# Patient Record
Sex: Male | Born: 1980 | Race: White | Hispanic: No | Marital: Single | Smoking: Current every day smoker
Health system: Southern US, Community
[De-identification: ages and names within clinical notes are randomized; demographics above are authoritative.]

---

## 2022-05-13 ENCOUNTER — Emergency Department (HOSPITAL_COMMUNITY)
Admission: EM | Admit: 2022-05-13 | Discharge: 2022-05-14 | Disposition: A | Discharge status: Home / Self Care | Payer: Medicare Other | Attending: Emergency Medicine | Admitting: Emergency Medicine

## 2022-05-13 ENCOUNTER — Other Ambulatory Visit: Payer: Self-pay

## 2022-05-13 ENCOUNTER — Encounter (HOSPITAL_COMMUNITY): Payer: Self-pay | Admitting: Emergency Medicine

## 2022-05-13 DIAGNOSIS — M545 Low back pain, unspecified: Secondary | ICD-10-CM | POA: Diagnosis not present

## 2022-05-13 DIAGNOSIS — I10 Essential (primary) hypertension: Secondary | ICD-10-CM | POA: Insufficient documentation

## 2022-05-13 DIAGNOSIS — R079 Chest pain, unspecified: Secondary | ICD-10-CM | POA: Insufficient documentation

## 2022-05-13 DIAGNOSIS — R519 Headache, unspecified: Secondary | ICD-10-CM | POA: Diagnosis present

## 2022-05-13 DIAGNOSIS — R7309 Other abnormal glucose: Secondary | ICD-10-CM | POA: Insufficient documentation

## 2022-05-13 DIAGNOSIS — R5383 Other fatigue: Secondary | ICD-10-CM | POA: Diagnosis not present

## 2022-05-13 LAB — CBG MONITORING, ED: Glucose-Capillary: 122 mg/dL — ABNORMAL HIGH (ref 70–99)
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
## 2022-05-14 ENCOUNTER — Encounter (HOSPITAL_COMMUNITY): Payer: Self-pay

## 2022-05-14 ENCOUNTER — Emergency Department (HOSPITAL_COMMUNITY): Payer: Medicare Other

## 2022-05-14 ENCOUNTER — Emergency Department (HOSPITAL_COMMUNITY)
Admission: EM | Admit: 2022-05-14 | Discharge: 2022-05-14 | Disposition: A | Discharge status: Home / Self Care | Payer: Medicare Other | Source: Home / Self Care | Attending: Emergency Medicine | Admitting: Emergency Medicine

## 2022-05-14 DIAGNOSIS — R519 Headache, unspecified: Secondary | ICD-10-CM | POA: Insufficient documentation

## 2022-05-14 DIAGNOSIS — M545 Low back pain, unspecified: Secondary | ICD-10-CM | POA: Insufficient documentation

## 2022-05-14 DIAGNOSIS — R079 Chest pain, unspecified: Secondary | ICD-10-CM | POA: Insufficient documentation

## 2022-05-14 DIAGNOSIS — I10 Essential (primary) hypertension: Secondary | ICD-10-CM | POA: Insufficient documentation

## 2022-05-14 DIAGNOSIS — R5383 Other fatigue: Secondary | ICD-10-CM | POA: Insufficient documentation

## 2022-05-14 LAB — CBC
HCT: 45.9 % (ref 39.0–52.0)
Hemoglobin: 15.7 g/dL (ref 13.0–17.0)
MCH: 31.4 pg (ref 26.0–34.0)
MCHC: 34.2 g/dL (ref 30.0–36.0)
MCV: 91.8 fL (ref 80.0–100.0)
Platelets: 243 10*3/uL (ref 150–400)
RBC: 5 MIL/uL (ref 4.22–5.81)
RDW: 13.6 % (ref 11.5–15.5)
WBC: 7.5 10*3/uL (ref 4.0–10.5)
nRBC: 0 % (ref 0.0–0.2)

## 2022-05-14 LAB — COMPREHENSIVE METABOLIC PANEL
ALT: 31 U/L (ref 0–44)
AST: 17 U/L (ref 15–41)
Albumin: 3.8 g/dL (ref 3.5–5.0)
Alkaline Phosphatase: 81 U/L (ref 38–126)
Anion gap: 8 (ref 5–15)
BUN: 11 mg/dL (ref 6–20)
CO2: 24 mmol/L (ref 22–32)
Calcium: 8.6 mg/dL — ABNORMAL LOW (ref 8.9–10.3)
Chloride: 106 mmol/L (ref 98–111)
Creatinine, Ser: 0.85 mg/dL (ref 0.61–1.24)
GFR, Estimated: >60 mL/min (ref >60)
Glucose, Bld: 128 mg/dL — ABNORMAL HIGH (ref 70–99)
Potassium: 3.9 mmol/L (ref 3.5–5.1)
Sodium: 138 mmol/L (ref 135–145)
Total Bilirubin: 0.3 mg/dL (ref 0.3–1.2)
Total Protein: 6.8 g/dL (ref 6.5–8.1)

## 2022-05-14 MED ORDER — LORAZEPAM 1 MG PO TABS
1.0000 mg | ORAL_TABLET | Freq: Once | ORAL | Status: AC
  Administered 2022-05-14: 1 mg via ORAL
  Filled 2022-05-14: qty 1

## 2022-05-14 MED ORDER — ACETAMINOPHEN 500 MG PO TABS
1000.0000 mg | ORAL_TABLET | Freq: Once | ORAL | Status: AC
  Administered 2022-05-14: 1000 mg via ORAL
  Filled 2022-05-14: qty 2
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
## 2022-05-14 NOTE — ED Notes (Signed)
ED Provider at bedside. 

## 2022-05-14 NOTE — Discharge Instructions (Addendum)
Your CT scan was read by a neuroradiologist.  This is a doctor who specializes in reading CT scans of the brain.  Your CT scan is completely normal.  Please see your neurologist, as scheduled.

## 2022-05-14 NOTE — ED Notes (Signed)
RN reviewed discharge instructions with pt. Pt verbalized understanding and had no further questions. VSS upon discharge.  

## 2022-05-14 NOTE — ED Triage Notes (Signed)
Pt arrived via EMS, was at urgent care looking for neurologist. C/o pain throughout entire body, states he was seen at [facility] yesterday for same. States he had head CT, but does not believe the read of the CT.

## 2022-05-14 NOTE — ED Notes (Signed)
Pt returned to room  

## 2022-05-14 NOTE — ED Notes (Signed)
Roxanne Mins MD reviewed CT scan results with pt. Pt stated that there was a "dark area" on the scans. MD informed pt that his scans were unremarkable. Pt stated he wanted a second opinion and proceeded to leave to go to the lobby. This RN and Roxanne Mins MD attempted to redirect pt back to room with no success
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
## 2022-05-14 NOTE — ED Triage Notes (Signed)
Pt returns to get results of CT scan, left AMA from ER during tx for HA and low back pain, see previous triage note.

## 2022-05-14 NOTE — Discharge Instructions (Signed)
You may apply ice to your lower back as needed.  Ice to be applied for 30 minutes at a time, as often as 4 times a day.  You may take ibuprofen and/or acetaminophen as needed for pain-either your headache or backache.  Please be aware that if you combine ibuprofen and acetaminophen, you will get better pain relief than you get from either medication by itself.  Please follow-up with the neurologist that you say you have an appointment with.

## 2022-05-14 NOTE — ED Notes (Signed)
Patient transported to CT 

## 2022-05-14 NOTE — ED Notes (Signed)
Patient states he is leaving. RN notified

## 2022-05-14 NOTE — ED Notes (Signed)
Urine sample collected in triage 

## 2022-05-14 NOTE — ED Notes (Signed)
Pt left before discharge paperwork was given. This RN reviewed CT results with pt before pt left.

## 2022-05-14 NOTE — ED Provider Notes (Signed)
[facility] EMERGENCY DEPARTMENT Provider Note   CSN: [id number] Arrival date & time: 05/14/22  8338     History  Chief Complaint  Patient presents with   Headache    Micheal Short is a 41 y.o. male.  The history is provided by the patient.  Headache He has history of hypertension, bipolar disorder, substance abuse and had just been discharged from the ED following evaluation for headache and low back pain.  Symptoms have been present for about a month and he had presented here requesting CT scan and stating that he was supposed to see his neurologist later today.  CT was negative, but he returns stating he knows that something is wrong and he wants to see his CT scan.   Home Medications Prior to Admission medications   Medication Sig Start Date End Date Taking? Authorizing Provider  benztropine (COGENTIN) 0.5 MG tablet Take 0.5 mg by mouth 2 (two) times daily. 04/23/22  Yes [provider]  OLANZapine zydis (ZYPREXA) 10 MG disintegrating tablet Take 10 mg by mouth 2 (two) times daily. 04/26/22  Yes [provider]  QUEtiapine (SEROQUEL) 400 MG tablet Take 800 mg by mouth at bedtime. 04/23/22  Yes [provider]      Allergies    Haldol [haloperidol]    Review of Systems   Review of Systems  Neurological:  Positive for headaches.  All other systems reviewed and are negative.  Physical Exam Updated Vital Signs BP 133/90 (BP Location: Right Arm)   Pulse (!) 102   Temp 98.2 F (36.8 C) (Oral)   Resp 20   SpO2 99%  Physical Exam Vitals and nursing note reviewed.  41 year old male, resting comfortably and in no acute distress. Vital signs are significant for borderline elevated heart rate. Oxygen saturation is 99%, which is normal. Head is normocephalic and atraumatic. PERRLA, EOMI. Oropharynx is clear. Neck is nontender and supple without adenopathy or JVD. Back is nontender and there is no CVA tenderness. Lungs are clear without  rales, wheezes, or rhonchi. Chest is nontender. Heart has regular rate and rhythm without murmur. Abdomen is soft, flat, nontender. Extremities have no cyanosis or edema, full range of motion is present. Skin is warm and dry without rash. Neurologic: Mental status is normal, cranial nerves are intact, moves all extremities equally, station and gait are normal.  ED Results / Procedures / Treatments    Radiology CT Head Wo Contrast  Result Date: 05/14/2022 CLINICAL DATA:  Headache EXAM: CT HEAD WITHOUT CONTRAST TECHNIQUE: Contiguous axial images were obtained from the base of the skull through the vertex without intravenous contrast. RADIATION DOSE REDUCTION: This exam was performed according to the departmental dose-optimization program which includes automated exposure control, adjustment of the mA and/or kV according to patient size and/or use of iterative reconstruction technique. COMPARISON:  None Available. FINDINGS: Brain: No evidence of acute infarction, hemorrhage, hydrocephalus, extra-axial collection or mass lesion/mass effect. Vascular: No hyperdense vessel or unexpected calcification. Skull: Normal. Negative for fracture or focal lesion. Sinuses/Orbits: The visualized paranasal sinuses are essentially clear. The mastoid air cells are unopacified. Other: None. IMPRESSION: Normal head CT. Electronically Signed   By: Charline Bills M.D.   On: 05/14/2022 01:14    Procedures Procedures   Medications Ordered in ED Medications - No data to display  ED Course/ Medical Decision Making/ A&P  Medical Decision Making  Subacute headache and low back pain.  I had completed his evaluation earlier tonight, no new work-up is indicated.  Patient is shown his CT scan and he points to sulci at the right vertex and says that that black area is a problem that he needs attention given to.  Patient was advised that his CT was normal and a been read by a neuroradiologist.   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  I am encouraging him to follow-up with his neurologist as scheduled.  Final Clinical Impression(s) / ED Diagnoses Final diagnoses:  Nonintractable headache, unspecified chronicity pattern, unspecified headache type  Midline low back pain without sciatica, unspecified chronicity    Rx / DC Orders ED Discharge Orders     None         Dione Booze, MD 05/14/22 [phone]

## 2022-05-14 NOTE — ED Notes (Signed)
Pt left ED before receiving d/c paperwork.

## 2023-06-01 IMAGING — CT CT HEAD W/O CM
4 series · 16 of 47 positions shown, 18 images · non-contrast
Comparison: None Available.

CLINICAL DATA: Headache



[Series 3: head wo · axial · 0.44mm/px · z∈[-20,+100]mm · 7 of 34 slices shown, 9 images]
[im 5/34  brain]
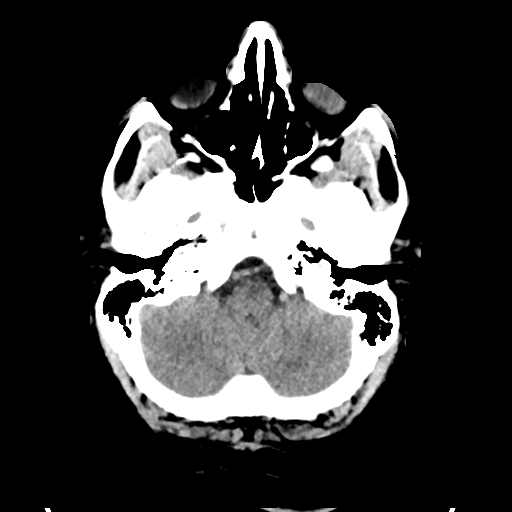
[im 5/34  bone]
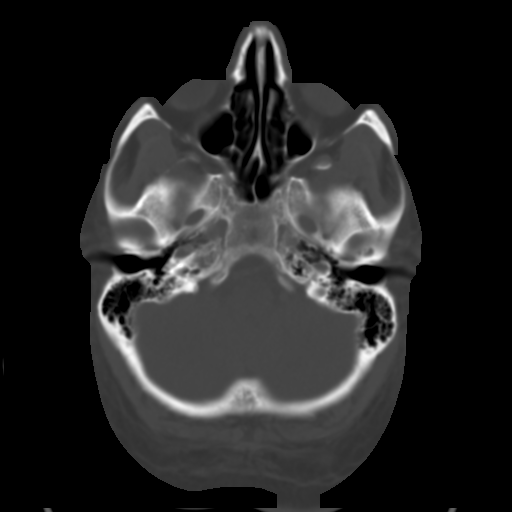
[im 9/34  brain]
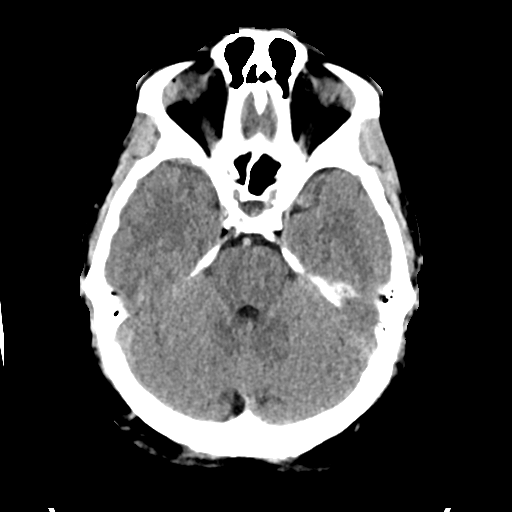
[im 13/34  brain]
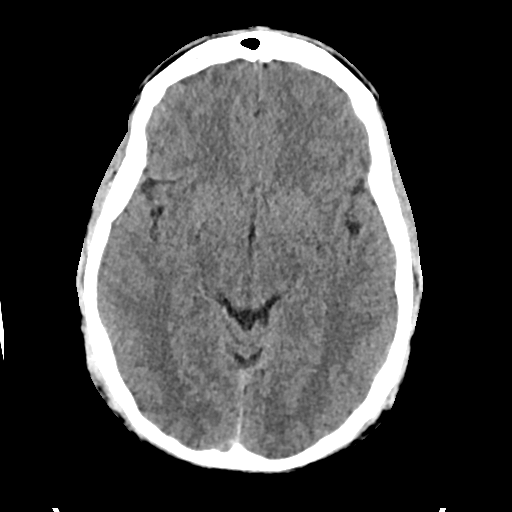
[im 17/34  brain]
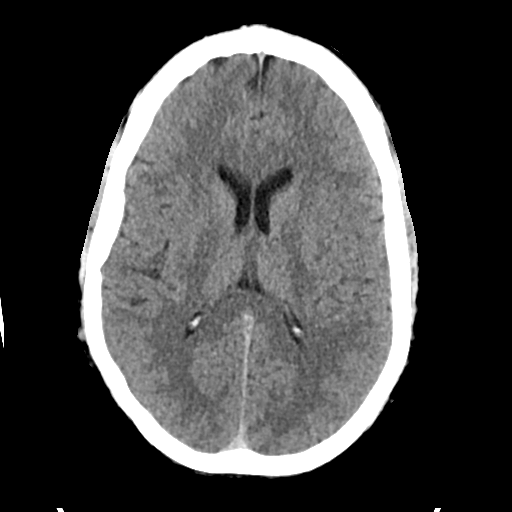
[im 21/34  brain]
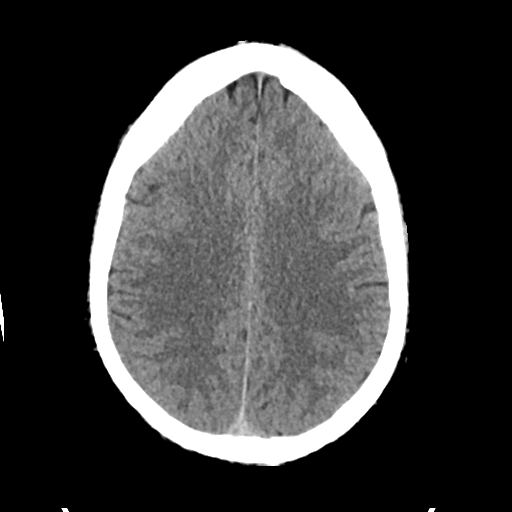
[im 21/34  bone]
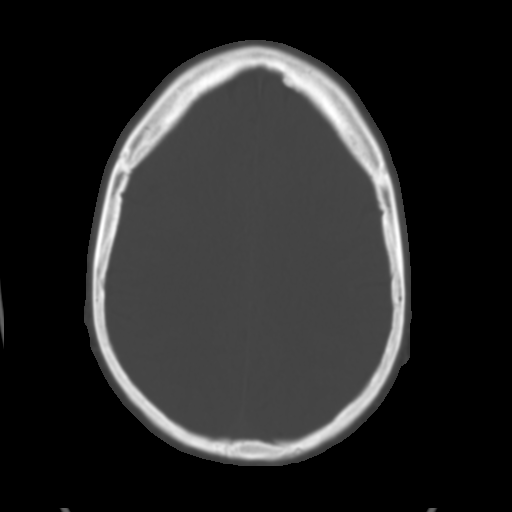
[im 25/34  brain]
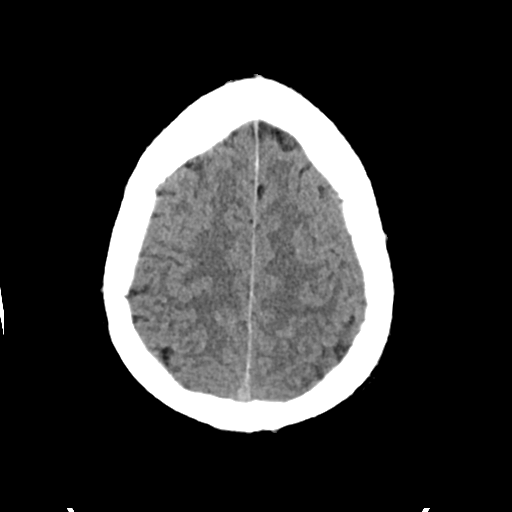
[im 29/34  brain]
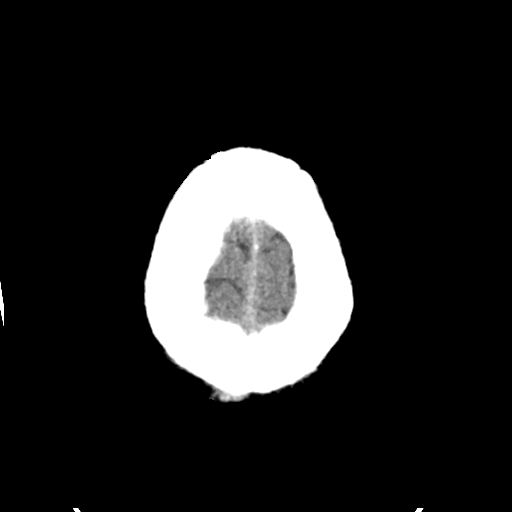

[Series 4: head bone · axial · 0.44mm/px · z∈[-24,+10]mm · 3 of 85 slices shown]
[im 9/85  bone]
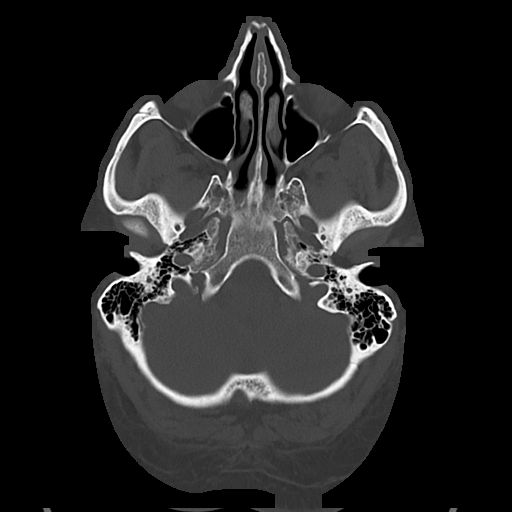
[im 17/85  bone]
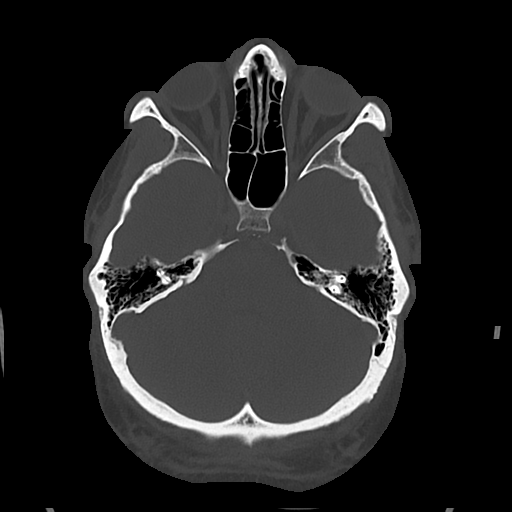
[im 26/85  bone]
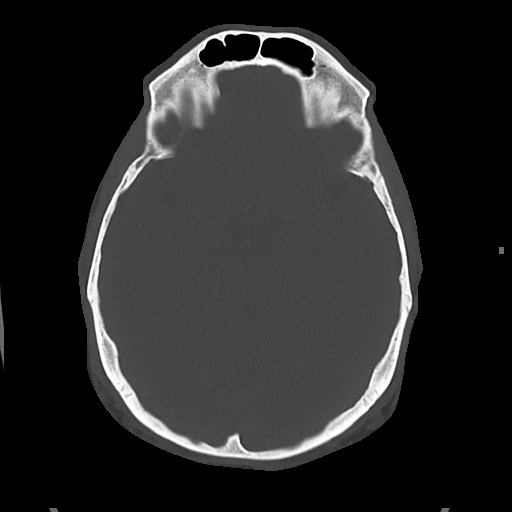

[Series 5: cor soft · coronal · 0.35mm/px · 3 of 76 slices shown]
[im 26/76  brain]
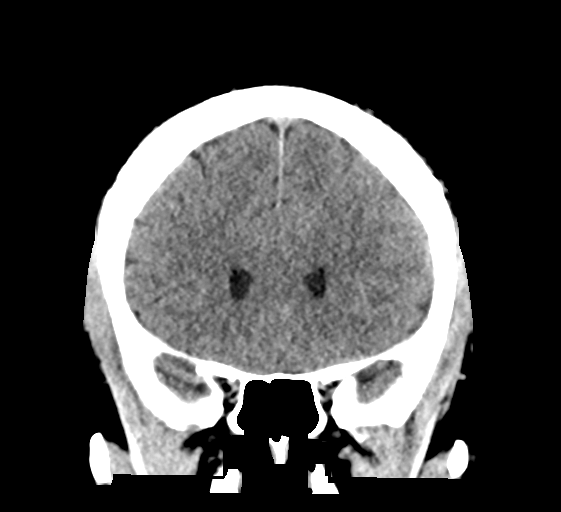
[im 34/76  brain]
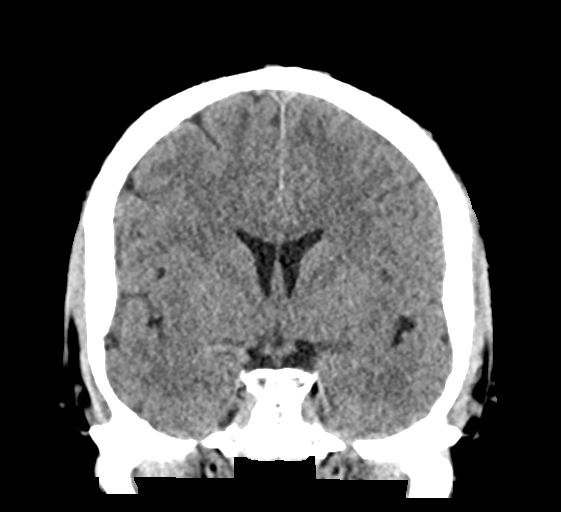
[im 42/76  brain]
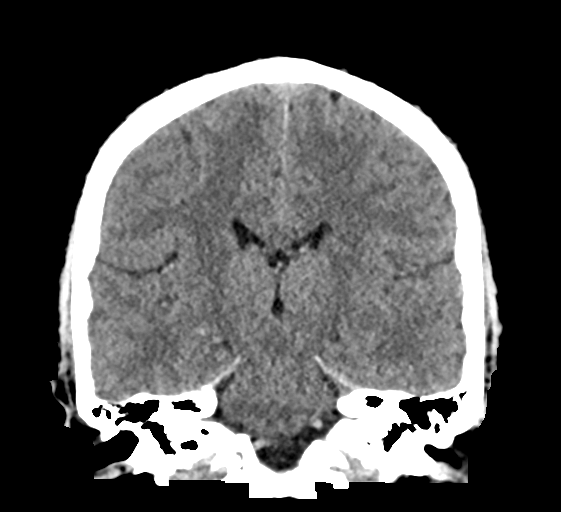

[Series 6: sag soft · sagittal · 0.34mm/px · 3 of 66 slices shown]
[im 22/66  brain]
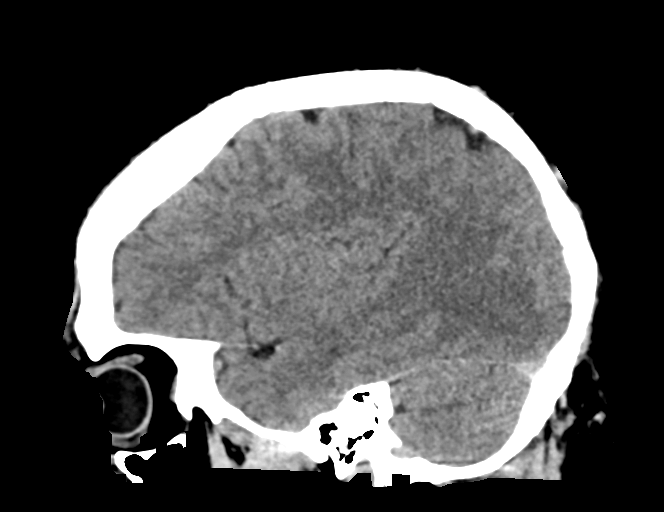
[im 33/66  brain]
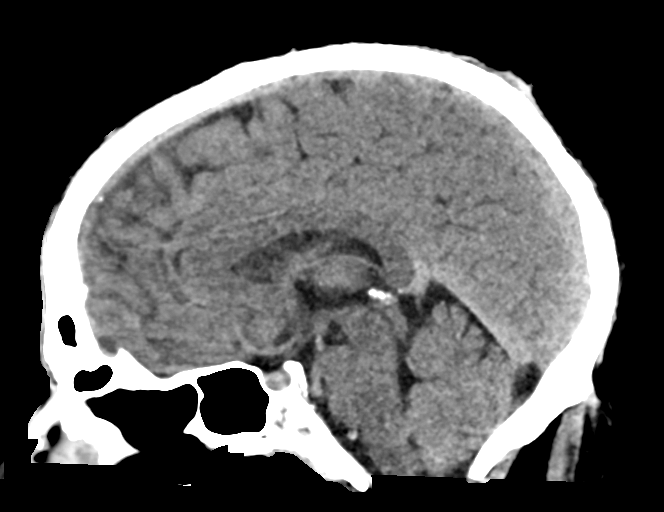
[im 44/66  brain]
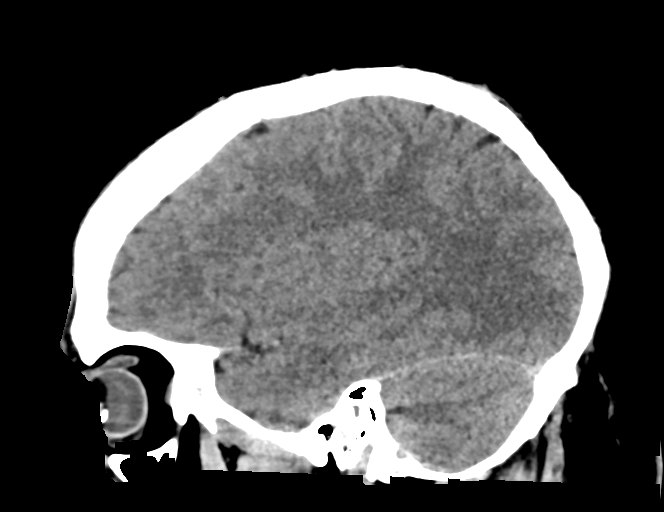

[16 of 47 positions shown; findings below may reference images not displayed]

FINDINGS: Brain: No evidence of acute infarction, hemorrhage, hydrocephalus,
extra-axial collection or mass lesion/mass effect.

Vascular: No hyperdense vessel or unexpected calcification.

Skull: Normal. Negative for fracture or focal lesion.

Sinuses/Orbits: The visualized paranasal sinuses are essentially
clear. The mastoid air cells are unopacified.

Other: None.
IMPRESSION: Normal head CT.
